# Patient Record
Sex: Male | Born: 1940 | Race: White | Hispanic: No | Marital: Single | Smoking: Never smoker
Health system: Southern US, Community
[De-identification: ages and names within clinical notes are randomized; demographics above are authoritative.]

## PROBLEM LIST (undated history)

## (undated) DIAGNOSIS — F0390 Unspecified dementia without behavioral disturbance: Secondary | ICD-10-CM

## (undated) DIAGNOSIS — G2581 Restless legs syndrome: Secondary | ICD-10-CM

## (undated) DIAGNOSIS — M1990 Unspecified osteoarthritis, unspecified site: Secondary | ICD-10-CM

## (undated) DIAGNOSIS — K219 Gastro-esophageal reflux disease without esophagitis: Secondary | ICD-10-CM

---

## 2008-08-02 HISTORY — PX: PACEMAKER INSERTION: SHX728

## 2015-01-31 HISTORY — PX: LYMPH NODE DISSECTION: SHX5087

## 2015-02-10 ENCOUNTER — Ambulatory Visit
Admission: EM | Admit: 2015-02-10 | Discharge: 2015-02-10 | Disposition: A | Discharge status: Home / Self Care | Payer: Medicare Other | Attending: Internal Medicine | Admitting: Internal Medicine

## 2015-02-10 ENCOUNTER — Ambulatory Visit: Payer: Medicare Other

## 2015-02-10 ENCOUNTER — Encounter: Payer: Self-pay | Admitting: Emergency Medicine

## 2015-02-10 DIAGNOSIS — M069 Rheumatoid arthritis, unspecified: Secondary | ICD-10-CM | POA: Insufficient documentation

## 2015-02-10 DIAGNOSIS — S32020A Wedge compression fracture of second lumbar vertebra, initial encounter for closed fracture: Secondary | ICD-10-CM

## 2015-02-10 DIAGNOSIS — K219 Gastro-esophageal reflux disease without esophagitis: Secondary | ICD-10-CM | POA: Insufficient documentation

## 2015-02-10 DIAGNOSIS — Z950 Presence of cardiac pacemaker: Secondary | ICD-10-CM | POA: Insufficient documentation

## 2015-02-10 DIAGNOSIS — Y92099 Unspecified place in other non-institutional residence as the place of occurrence of the external cause: Secondary | ICD-10-CM | POA: Diagnosis not present

## 2015-02-10 DIAGNOSIS — F0390 Unspecified dementia without behavioral disturbance: Secondary | ICD-10-CM | POA: Insufficient documentation

## 2015-02-10 DIAGNOSIS — W19XXXA Unspecified fall, initial encounter: Secondary | ICD-10-CM

## 2015-02-10 DIAGNOSIS — W109XXA Fall (on) (from) unspecified stairs and steps, initial encounter: Secondary | ICD-10-CM | POA: Insufficient documentation

## 2015-02-10 DIAGNOSIS — Z79899 Other long term (current) drug therapy: Secondary | ICD-10-CM | POA: Insufficient documentation

## 2015-02-10 DIAGNOSIS — T148 Other injury of unspecified body region: Secondary | ICD-10-CM | POA: Diagnosis not present

## 2015-02-10 DIAGNOSIS — Y92009 Unspecified place in unspecified non-institutional (private) residence as the place of occurrence of the external cause: Secondary | ICD-10-CM | POA: Insufficient documentation

## 2015-02-10 DIAGNOSIS — G2581 Restless legs syndrome: Secondary | ICD-10-CM | POA: Insufficient documentation

## 2015-02-10 DIAGNOSIS — M4856XA Collapsed vertebra, not elsewhere classified, lumbar region, initial encounter for fracture: Secondary | ICD-10-CM | POA: Insufficient documentation

## 2015-02-10 DIAGNOSIS — M5136 Other intervertebral disc degeneration, lumbar region: Secondary | ICD-10-CM | POA: Diagnosis not present

## 2015-02-10 DIAGNOSIS — M542 Cervicalgia: Secondary | ICD-10-CM | POA: Diagnosis present

## 2015-02-10 HISTORY — DX: Gastro-esophageal reflux disease without esophagitis: K21.9

## 2015-02-10 HISTORY — DX: Unspecified dementia, unspecified severity, without behavioral disturbance, psychotic disturbance, mood disturbance, and anxiety: F03.90

## 2015-02-10 HISTORY — DX: Restless legs syndrome: G25.81

## 2015-02-10 HISTORY — DX: Unspecified osteoarthritis, unspecified site: M19.90

## 2015-02-10 NOTE — Discharge Instructions (Signed)
Xrays of the low back at the urgent care today showed a compression fracture at L2;  neck xrays just showed arthritis. Try ice to low back to help with pain/swelling for the next few days. Voltaren gel applied to the low back a couple times daily may also be helpful. Followup with Dr Maudie Mercury in several days to discuss possible further treatment options. Anticipate slow improvement over the next 2-3 months.  Back, Compression Fracture A compression fracture happens when a force is put upon the length of your spine. Slipping and falling on your bottom are examples of such a force. When this happens, sometimes the force is great enough to compress the building blocks (vertebral bodies) of your spine. Although this causes a lot of pain, this can usually be treated at home, unless your caregiver feels hospitalization is needed for pain control. Your backbone (spinal column) is made up of 24 main vertebral bodies in addition to the sacrum and coccyx (see illustration). These are held together by tough fibrous tissues (ligaments) and by support of your muscles. Nerve roots pass through the openings between the vertebrae. A sudden wrenching move, injury, or a fall may cause a compression fracture of one of the vertebral bodies. This may result in back pain or spread of pain into the belly (abdomen), the buttocks, and down the leg into the foot. Pain may also be created by muscle spasm alone. Large studies have been undertaken to determine the best possible course of action to help your back following injury and also to prevent future problems. The recommendations are as follows. FOLLOWING A COMPRESSION FRACTURE: Do the following only if advised by your caregiver.   If a back brace has been suggested or provided, wear it as directed.  Do not stop wearing the back brace unless instructed by your caregiver.  When allowed to return to regular activities, avoid a sedentary lifestyle. Actively exercise.  Sporadic weekend binges of tennis, racquetball, or waterskiing may actually aggravate or create problems, especially if you are not in condition for that activity.  Avoid sports requiring sudden body movements until you are in condition for them. Swimming and walking are safer activities.  Maintain good posture.  Avoid obesity.  If not already done, you should have a DEXA scan. Based on the results, be treated for osteoporosis. FOLLOWING ACUTE (SUDDEN) INJURY:  Only take over-the-counter or prescription medicines for pain, discomfort, or fever as directed by your caregiver.  Use bed rest for only the most extreme acute episode. Prolonged bed rest may aggravate your condition. Ice used for acute conditions is effective. Use a large plastic bag filled with ice. Wrap it in a towel. This also provides excellent pain relief. This may be continuous. Or use it for 30 minutes every 2 hours during acute phase, then as needed. Heat for 30 minutes prior to activities is helpful.  As soon as the acute phase (the time when your back is too painful for you to do normal activities) is over, it is important to resume normal activities and work Arboriculturist. Back injuries can cause potentially marked changes in lifestyle. So it is important to attack these problems aggressively.  See your caregiver for continued problems. He or she can help or refer you for appropriate exercises, physical therapy, and work hardening if needed.  If you are given narcotic medications for your condition, for the next 24 hours do not:  Drive.  Operate machinery or power tools.  Sign legal documents.  Do not  drink alcohol, or take sleeping pills or other medications that may interfere with treatment. If your caregiver has given you a follow-up appointment, it is very important to keep that appointment. Not keeping the appointment could result in a chronic or permanent injury, pain, and disability. If there is any problem  keeping the appointment, you must call back to this facility for assistance.  SEEK IMMEDIATE MEDICAL CARE IF:  You develop numbness, tingling, weakness, or problems with the use of your arms or legs.  You develop severe back pain not relieved with medications.  You have changes in bowel or bladder control.  You have increasing pain in any areas of the body. Document Released: 07/19/2005 Document Revised: 12/03/2013 Document Reviewed: 02/21/2008 [facility] Patient Information 2015 [place], [place]. This information is not intended to replace advice given to you by your health care provider. Make sure you discuss any questions you have with your health care provider.

## 2015-02-10 NOTE — ED Notes (Signed)
Fell down 4 flight of stairs on yesterday. Now having right shoulder pain, feet swollen and lower back pain.

## 2015-02-10 NOTE — ED Provider Notes (Signed)
CSN: [id number]     Arrival date & time 02/10/15  1714 History   First MD Initiated Contact with Patient 02/10/15 1834     Chief Complaint  Patient presents with  . Fall  HPI Patient is a 74 year old gentleman with history of rheumatoid arthritis. He slipped at the top of some steps yesterday, and fell backwards, and slid all the way down the stairs. He feels stiff and sore today, having some discomfort in the mid posterior neck, and in the midline low back. He is able to walk independently. He says that he did not hit his head, and denies headache.  Past Medical History  Diagnosis Date  . GERD (gastroesophageal reflux disease)   . Restless leg   . Arthritis   . Dementia    Past Surgical History  Procedure Laterality Date  . Lymph node dissection Bilateral July 2016    cance from lymph nodes  . Pacemaker insertion  2010   No family history on file. History  Substance Use Topics  . Smoking status: Never Smoker   . Smokeless tobacco: Never Used  . Alcohol Use: No    Review of Systems  All other systems reviewed and are negative.   Allergies  Biaxin; Ciprofloxacin; Ibuprofen; Oxybutynin; and Sulfa antibiotics  Home Medications   Prior to Admission medications   Medication Sig Start Date End Date Taking? Authorizing Provider  Certolizumab Pegol (CIMZIA) 2 X 200 MG KIT Inject into the skin.   Yes Historical Provider, MD  donepezil (ARICEPT) 10 MG tablet Take 10 mg by mouth at bedtime.   Yes Historical Provider, MD  esomeprazole (NEXIUM) 40 MG capsule Take 40 mg by mouth daily at 12 noon.   Yes Historical Provider, MD  ferrous sulfate 325 (65 FE) MG tablet Take 325 mg by mouth daily with breakfast.   Yes Historical Provider, MD  fluticasone (FLONASE) 50 MCG/ACT nasal spray Place into both nostrils daily.   Yes Historical Provider, MD  folic acid (FOLVITE) 1 MG tablet Take 1 mg by mouth daily.   Yes Historical Provider, MD  methotrexate (RHEUMATREX) 2.5 MG tablet Take 7.5 mg by  mouth 3 (three) times a week.   Yes Historical Provider, MD  mirabegron ER (MYRBETRIQ) 25 MG TB24 tablet Take 25 mg by mouth daily.   Yes Historical Provider, MD  Multiple Vitamins-Minerals (MULTIVITAMIN ADULT PO) Take by mouth.   Yes Historical Provider, MD  potassium chloride (MICRO-K) 10 MEQ CR capsule Take 10 mEq by mouth 2 (two) times daily.   Yes Historical Provider, MD  ranitidine (ZANTAC) 150 MG tablet Take 150 mg by mouth 2 (two) times daily.   Yes Historical Provider, MD  rOPINIRole (REQUIP) 0.25 MG tablet Take 0.25 mg by mouth 3 (three) times daily.   Yes Historical Provider, MD  sertraline (ZOLOFT) 100 MG tablet Take 100 mg by mouth daily.   Yes Historical Provider, MD   BP 138/57 mmHg  Pulse 69  Temp(Src) 97.9 F (36.6 C) (Tympanic)  Resp 14  Ht $R'5\' 8"'Bd$  (1.727 m)  Wt 144 lb (65.318 kg)  BMI 21.90 kg/m2  SpO2 69% Physical Exam  Constitutional: He is oriented to person, place, and time. No distress.  Alert, nicely groomed  HENT:  Head: Atraumatic.  Eyes:  Conjugate gaze, no eye redness/drainage  Neck: Neck supple.  Mildly tender to percussion in the midline posteriorly, at about C5 Also mildly tender to percussion in the midline at about L2-L3  Cardiovascular: Normal rate and regular rhythm.  Pulmonary/Chest: No respiratory distress. He has no wheezes. He has no rales.  Lungs clear, symmetric breath sounds  Abdominal: Soft. He exhibits no distension.  Musculoskeletal: Normal range of motion.  Range of motion of both shoulders is full, includes extension of the arms overhead, internal and external rotation. He has a bruise on one dorsal hand, but no focal swelling, able to make a fist with both hands and move both wrists freely. No focal swelling of the knees, good range of motion of both ankles.  Neurological: He is alert and oriented to person, place, and time.  Skin: Skin is warm and dry.  No cyanosis  Nursing note and vitals reviewed.   ED Course   Procedures Imaging Review Dg Cervical Spine Complete  02/10/2015   CLINICAL DATA:  Fall down stairs yesterday. Posterior neck pain. Initial encounter.  EXAM: CERVICAL SPINE  4+ VIEWS  COMPARISON:  None.  FINDINGS: The prevertebral soft tissues are normal. The alignment is anatomic through T1. There is no evidence of acute fracture or traumatic subluxation. The C1-2 articulation appears normal in the AP projection. There are mild degenerative changes, primarily at C1-2. There is mild ossification of the ligamentum nuchae per surgical clips are noted in the upper neck. Patient has a pacemaker.  IMPRESSION: No evidence of acute cervical spine fracture, traumatic subluxation or static signs of instability. Mild spondylosis for age.   Electronically Signed   By: Richardean Sale M.D.   On: 02/10/2015 19:35   Dg Lumbar Spine Complete  02/10/2015   CLINICAL DATA:  Fall yesterday. Initial encounter. Lumbago/low back pain.  EXAM: LUMBAR SPINE - COMPLETE 4+ VIEW  COMPARISON:  None.  FINDINGS: L2 compression fracture is present, likely acute. The fracture margins appear crisp. Maximal loss of vertebral body height is about 40%. No radiographic evidence of retropulsion. More chronic appearing T12 and L1 compression fractures. Mild levoconvex curve of the lumbar spine is present on the frontal projection. No spondylolisthesis. L4-L5 and L5-S1 predominant degenerative disc disease. Surgical clips are present over the abdomen. Pacemaker lead partially visible. Osteopenia.  IMPRESSION: Acute appearing L2 compression fracture with 40% loss of vertebral body height and no visible retropulsion radiographically. Chronic appearing T12 and L1 compression fractures.   Electronically Signed   By: Dereck Ligas M.D.   On: 02/10/2015 19:38      MDM   1. Compression fracture of L2 lumbar vertebra, closed, initial encounter   2. Fall at home, initial encounter   3.      Contusions  Use voltaren gel (daughter has) and ice to  help with pain.  Be careful with taking percocet (has), as these may increase risk of falling/injury.  Followup with pcp/Dr Crummett in several days.   Sherlene Shams, MD 02/12/15 [phone]

## 2016-05-02 DEATH — deceased

## 2017-01-29 IMAGING — CR DG CERVICAL SPINE COMPLETE 4+V
7 series · 7 of 7 positions shown · non-contrast
Comparison: None.

CLINICAL DATA: Fall down stairs yesterday. Posterior neck pain.
Initial encounter.

EXAM:
CERVICAL SPINE  4+ VIEWS

[c-spine lat (1 of 2)]
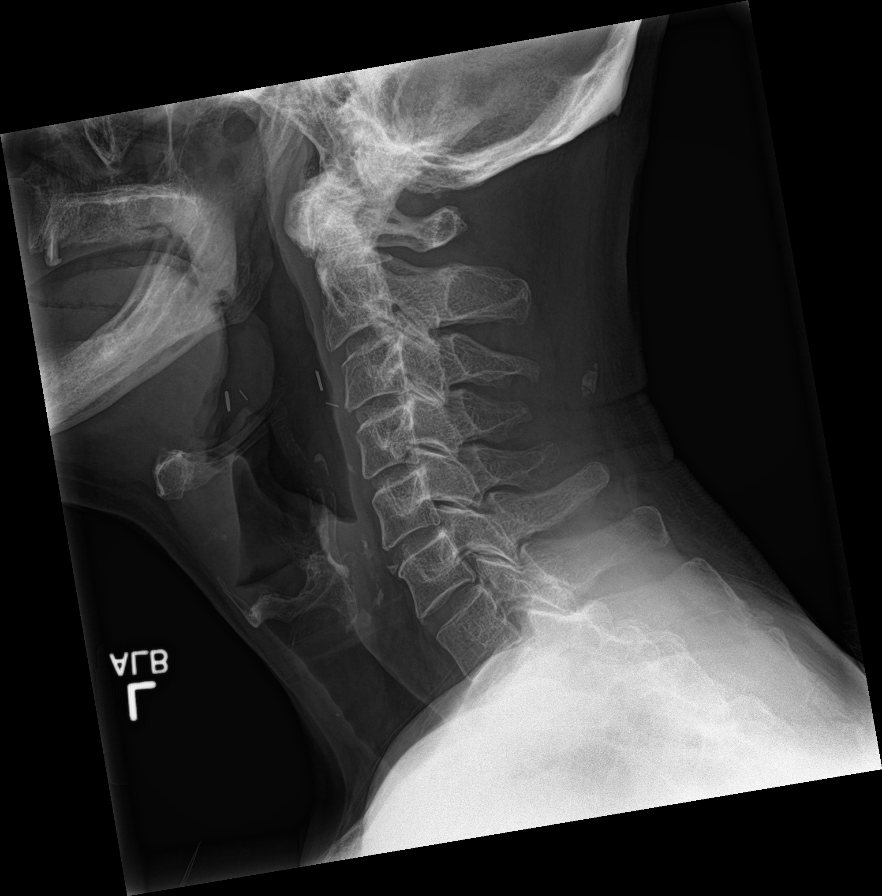

[c-spine obl (1 of 2)]
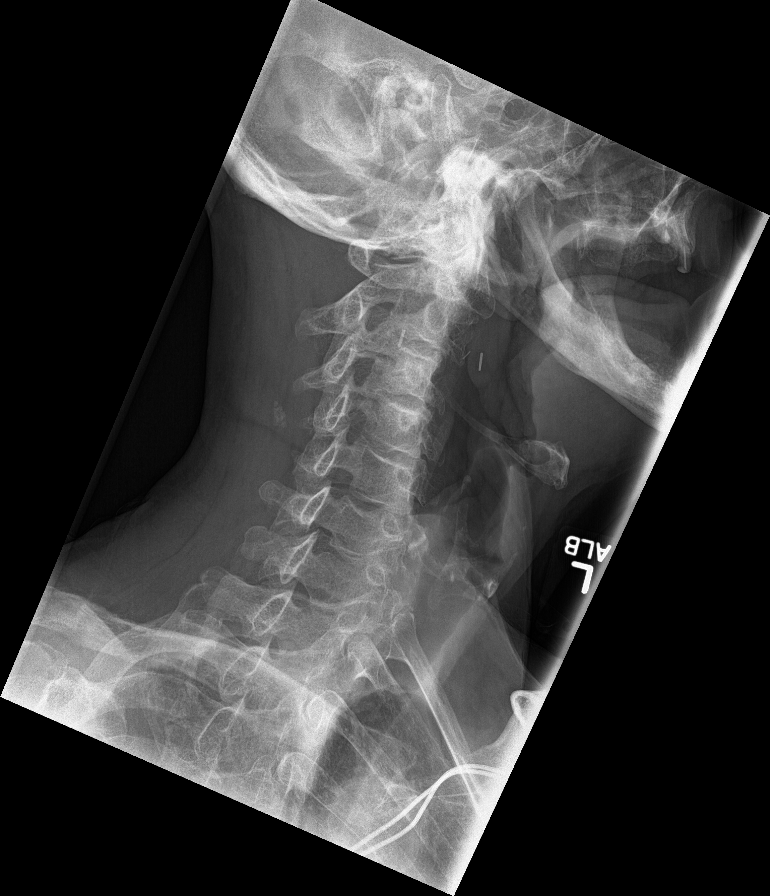

[c-spine obl (2 of 2)]
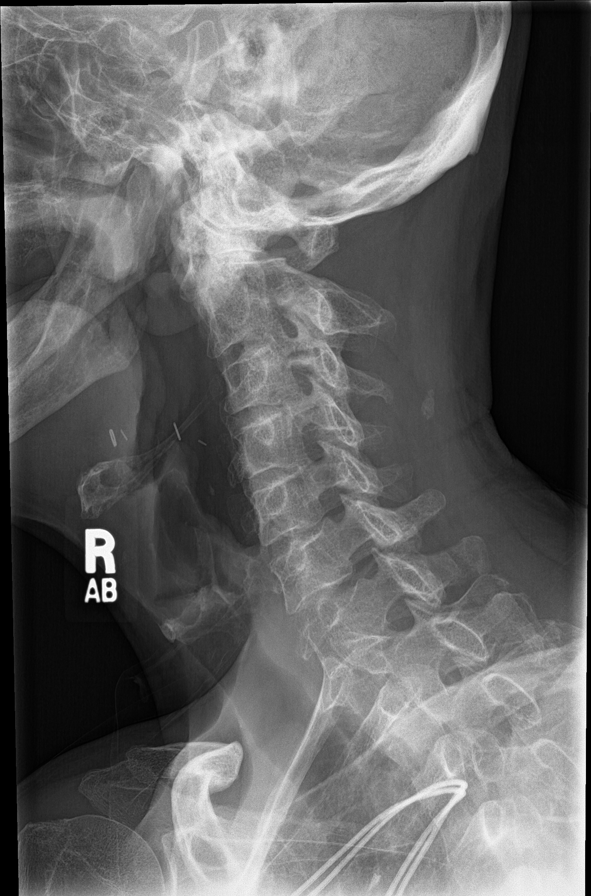

[c-spine ap (1 of 2)]
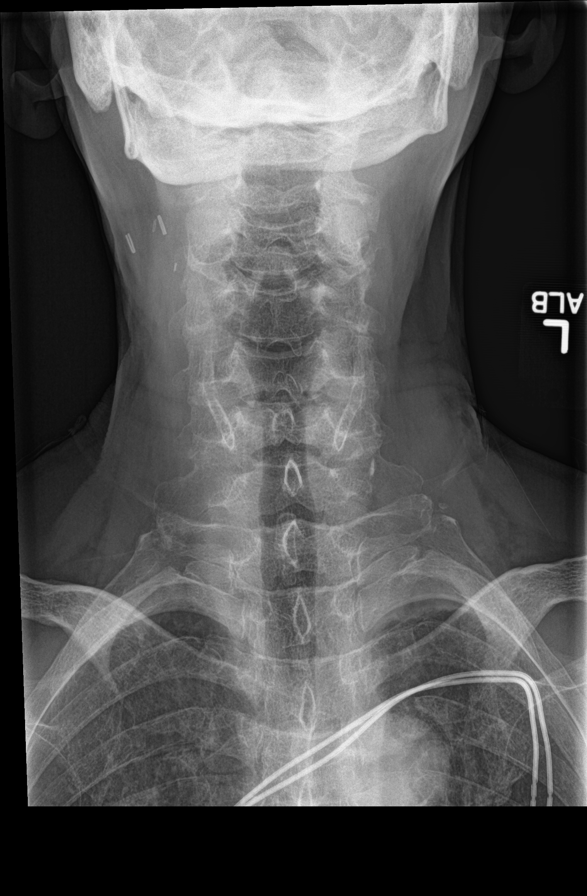

[c-spine open mouth]
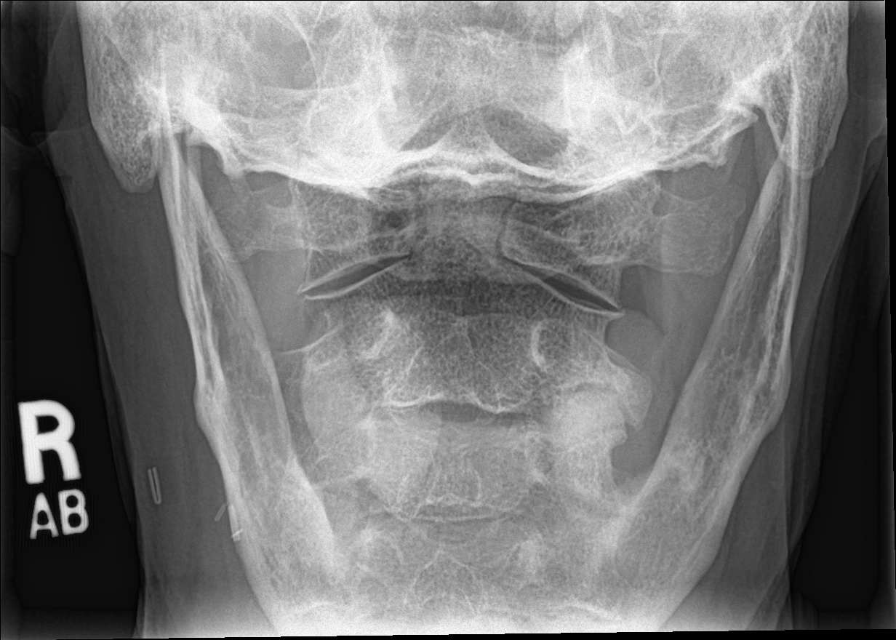

[c-spine ap (2 of 2)]
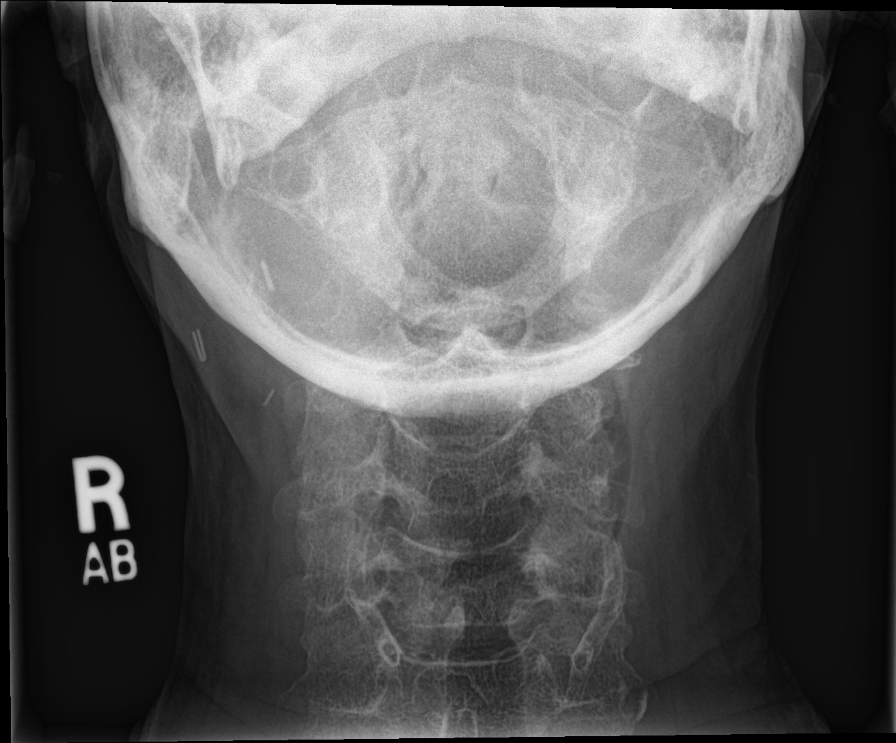

[c-spine lat (2 of 2)]
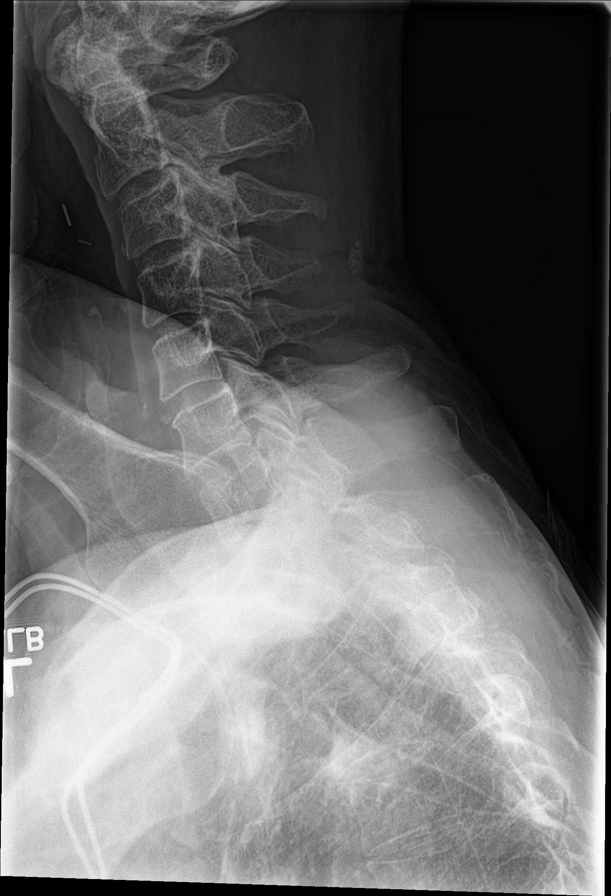

[7 of 7 positions shown; findings below may reference images not displayed]

FINDINGS: The prevertebral soft tissues are normal. The alignment is anatomic
through T1. There is no evidence of acute fracture or traumatic
subluxation. The C1-2 articulation appears normal in the AP
projection. There are mild degenerative changes, primarily at C1-2.
There is mild ossification of the ligamentum nuchae per surgical
clips are noted in the upper neck. Patient has a pacemaker.
IMPRESSION: No evidence of acute cervical spine fracture, traumatic subluxation
or static signs of instability. Mild spondylosis for age.
